# Patient Record
Sex: Male | Born: 2000 | Race: Black or African American | Hispanic: No | Marital: Single | Smoking: Never smoker
Health system: Southern US, Community
[De-identification: ages and names within clinical notes are randomized; demographics above are authoritative.]

## PROBLEM LIST (undated history)

## (undated) DIAGNOSIS — J45909 Unspecified asthma, uncomplicated: Secondary | ICD-10-CM

## (undated) DIAGNOSIS — E559 Vitamin D deficiency, unspecified: Secondary | ICD-10-CM

## (undated) HISTORY — DX: Unspecified asthma, uncomplicated: J45.909

## (undated) HISTORY — DX: Vitamin D deficiency, unspecified: E55.9

---
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
## 2020-01-31 ENCOUNTER — Encounter: Payer: Managed Care, Other (non HMO) | Admitting: Family Medicine

## 2020-05-06 ENCOUNTER — Ambulatory Visit (INDEPENDENT_AMBULATORY_CARE_PROVIDER_SITE_OTHER): Payer: Managed Care, Other (non HMO) | Admitting: Family Medicine

## 2020-05-06 ENCOUNTER — Other Ambulatory Visit: Payer: Self-pay

## 2020-05-06 ENCOUNTER — Encounter: Payer: Self-pay | Admitting: Family Medicine

## 2020-05-06 VITALS — BP 118/72 | HR 60 | Ht 69.0 in | Wt 163.0 lb

## 2020-05-06 DIAGNOSIS — F419 Anxiety disorder, unspecified: Secondary | ICD-10-CM | POA: Diagnosis not present

## 2020-05-06 DIAGNOSIS — R5383 Other fatigue: Secondary | ICD-10-CM | POA: Diagnosis not present

## 2020-05-06 DIAGNOSIS — Z23 Encounter for immunization: Secondary | ICD-10-CM

## 2020-05-06 DIAGNOSIS — E559 Vitamin D deficiency, unspecified: Secondary | ICD-10-CM

## 2020-05-06 DIAGNOSIS — F32A Depression, unspecified: Secondary | ICD-10-CM | POA: Diagnosis not present

## 2020-05-06 DIAGNOSIS — L03011 Cellulitis of right finger: Secondary | ICD-10-CM

## 2020-05-06 NOTE — Patient Instructions (Addendum)
Please contact the counseling center at school as we discussed. Try and get regular exercise. It is important that you eat--even if it isn't a lot.  Your brain and body need the fuel.   Mindfulness-Based Stress Reduction Mindfulness-based stress reduction (MBSR) is a program that helps people learn to practice mindfulness. Mindfulness is the practice of intentionally paying attention to the present moment. It can be learned and practiced through techniques such as education, breathing exercises, meditation, and yoga. MBSR includes several mindfulness techniques in one program. MBSR works best when you understand the treatment, are willing to try new things, and can commit to spending time practicing what you learn. MBSR training may include learning about:  How your emotions, thoughts, and reactions affect your body.  New ways to respond to things that cause negative thoughts to start (triggers).  How to notice your thoughts and let go of them.  Practicing awareness of everyday things that you normally do without thinking.  The techniques and goals of different types of meditation. What are the benefits of MBSR? MBSR can have many benefits, which include helping you to:  Develop self-awareness. This refers to knowing and understanding yourself.  Learn skills and attitudes that help you to participate in your own health care.  Learn new ways to care for yourself.  Be more accepting about how things are, and let things go.  Be less judgmental and approach things with an open mind.  Be patient with yourself and trust yourself more. MBSR has also been shown to:  Reduce negative emotions, such as depression and anxiety.  Improve memory and focus.  Change how you sense and approach pain.  Boost your body's ability to fight infections.  Help you connect better with other people.  Improve your sense of well-being. Follow these instructions at home:   Find a local in-person or  online MBSR program.  Set aside some time regularly for mindfulness practice.  Find a mindfulness practice that works best for you. This may include one or more of the following: ? Meditation. Meditation involves focusing your mind on a certain thought or activity. ? Breathing awareness exercises. These help you to stay present by focusing on your breath. ? Body scan. For this practice, you lie down and pay attention to each part of your body from head to toe. You can identify tension and soreness and intentionally relax parts of your body. ? Yoga. Yoga involves stretching and breathing, and it can improve your ability to move and be flexible. It can also provide an experience of testing your body's limits, which can help you release stress. ? Mindful eating. This way of eating involves focusing on the taste, texture, color, and smell of each bite of food. Because this slows down eating and helps you feel full sooner, it can be an important part of a weight-loss plan.  Find a podcast or recording that provides guidance for breathing awareness, body scan, or meditation exercises. You can listen to these any time when you have a free moment to rest without distractions.  Follow your treatment plan as told by your health care provider. This may include taking regular medicines and making changes to your diet or lifestyle as recommended. How to practice mindfulness To do a basic awareness exercise:  Find a comfortable place to sit.  Pay attention to the present moment. Observe your thoughts, feelings, and surroundings just as they are.  Avoid placing judgment on yourself, your feelings, or your surroundings. Make note  of any judgment that comes up, and let it go.  Your mind may wander, and that is okay. Make note of when your thoughts drift, and return your attention to the present moment. To do basic mindfulness meditation:  Find a comfortable place to sit. This may include a stable chair or a  firm floor cushion. ? Sit upright with your back straight. Let your arms fall next to your side with your hands resting on your legs. ? If sitting in a chair, rest your feet flat on the floor. ? If sitting on a cushion, cross your legs in front of you.  Keep your head in a neutral position with your chin dropped slightly. Relax your jaw and rest the tip of your tongue on the roof of your mouth. Drop your gaze to the floor. You can close your eyes if you like.  Breathe normally and pay attention to your breath. Feel the air moving in and out of your nose. Feel your belly expanding and relaxing with each breath.  Your mind may wander, and that is okay. Make note of when your thoughts drift, and return your attention to your breath.  Avoid placing judgment on yourself, your feelings, or your surroundings. Make note of any judgment or feelings that come up, let them go, and bring your attention back to your breath.  When you are ready, lift your gaze or open your eyes. Pay attention to how your body feels after the meditation. Where to find more information You can find more information about MBSR from:  Your health care provider.  Community-based meditation centers or programs.  Programs offered near you. Summary  Mindfulness-based stress reduction (MBSR) is a program that teaches you how to intentionally pay attention to the present moment. It is used with other treatments to help you cope better with daily stress, emotions, and pain.  MBSR focuses on developing self-awareness, which allows you to respond to life stress without judgment or negative emotions.  MBSR programs may involve learning different mindfulness practices, such as breathing exercises, meditation, yoga, body scan, or mindful eating. Find a mindfulness practice that works best for you, and set aside time for it on a regular basis. This information is not intended to replace advice given to you by your health care  provider. Make sure you discuss any questions you have with your health care provider. Document Revised: 05/07/2017 Document Reviewed: 10/01/2016 Elsevier Patient Education  2020 [employer].   Looks like you had a paronychia that you drained.  It looks to be healing/resolving.  There is a slight knot that remains.  I recommend warm soaks 2-3 times/day until that resolves.  If any increase in swelling, pain, drainage or fever, needs re-eval and antibiotics.   Paronychia Paronychia is an infection of the skin. It happens near a fingernail or toenail. It may cause pain and swelling around the nail. In some cases, a fluid-filled bump (abscess) can form near or under the nail. Usually, this condition is not serious, and it clears up with treatment. Follow these instructions at home: Wound care  Keep the affected area clean.  Soak the fingers or toes in warm water as told by your doctor. You may be told to do this for 20 minutes, 2-3 times a day.  Keep the area dry when you are not soaking it.  Do not try to drain a fluid-filled bump on your own.  Follow instructions from your doctor about how to take care of the  affected area. Make sure you: ? Wash your hands with soap and water before you change your bandage (dressing). If you cannot use soap and water, use hand sanitizer. ? Change your bandage as told by your doctor.  If you had a fluid-filled bump and your doctor drained it, check the area every day for signs of infection. Check for: ? Redness, swelling, or pain. ? Fluid or blood. ? Warmth. ? Pus or a bad smell. Medicines   Take over-the-counter and prescription medicines only as told by your doctor.  If you were prescribed an antibiotic medicine, take it as told by your doctor. Do not stop taking it even if you start to feel better. General instructions  Avoid touching any chemicals.  Do not pick at the affected area. Prevention  To prevent this condition from happening  again: ? Wear rubber gloves when putting your hands in water for washing dishes or other tasks. ? Wear gloves if your hands might touch cleaners or chemicals. ? Avoid injuring your nails or fingertips. ? Do not bite your nails or tear hangnails. ? Do not cut your nails very short. ? Do not cut the skin at the base and sides of the nail (cuticles). ? Use clean nail clippers or scissors when trimming nails. Contact a doctor if:  You feel worse.  You do not get better.  You have more fluid, blood, or pus coming from the affected area.  Your finger or knuckle is swollen or is hard to move. Get help right away if you have:  A fever or chills.  Redness spreading from the affected area.  Pain in a joint or muscle. Summary  Paronychia is an infection of the skin. It happens near a fingernail or toenail.  This condition may cause pain and swelling around the nail.  Soak the fingers or toes in warm water as told by your doctor.  Usually, this condition is not serious, and it clears up with treatment. This information is not intended to replace advice given to you by your health care provider. Make sure you discuss any questions you have with your health care provider. Document Revised: 06/11/2017 Document Reviewed: 06/07/2017 Elsevier Patient Education  2020 [employer].

## 2020-05-06 NOTE — Progress Notes (Signed)
Chief Complaint  Patient presents with  . Anxiety    has been dealing with some anxiety and sometimes feels sad. Loss of appetite and sleeps only about 5 hours a day, this has been going on for about a month.  His mom also wanted to know if we could do a covid test-wonders if maybe he has had covid in the recent past.   . Blister    had a blister on his finger on his right hand that was full of pus, he popped it and just wanted you to look at it to make sure it's not infected or in need of an antibiotic.    Patient presents to discuss anxiety, decreased appetite, weight loss.  He reports he has had symptoms x 1 month--tired a lot, not sleeping well at night--takes a long time to fall asleep.  He gets hungry, but loses appetite quickly.  Has been anxious, feels heart racing, can happen any time, even just watching TV.  He has noticed weight loss in chest/abdomen (showed me pictures on his phone of what he used to look like).  Hasn't been to the gym in 3 weeks, hasn't had time.  Not doing well in chem class (taking for the 2nd time), and this worries him.  He reports he "broke down" when talking to his mother last night, who became very concerned and scheduled visit.   He gave me permission to speak with her after the visit. She reported that there was also a breakup that may be contributing.  She noticed him not eating (and was worried that he could have been sick, worried about post-COVID--patient states he had NOT been ill at all).  Second concern today was that of a lesion/sore on his R index finger.  He reports it was swollen and painful.  They used a pin to drain it, drained pus and he reports it feels much better.  Denies any ongoing pain or recurrent swelling.  No injury/trauma.  Denies fever, chills.  Vitamin D deficiency noted in 10/2019, level of 21.1. He had been taking a daily D3, but stopped a couple of weeks ago (didn't run out, just stopped).  PMH, PSH, SH reviewed. Student at  [employer], with upcoming finals He denies any substance abuse  Outpatient Encounter Medications as of 05/06/2020  Medication Sig  . albuterol (VENTOLIN HFA) 108 (90 Base) MCG/ACT inhaler INHALE 2 PUFFS INTO LUNGS EVERY 6 HOURS AS NEEDED FOR WHEEZING OR SHORTNESS OF BREATH (Patient not taking: Reported on 05/06/2020)  . loratadine (CLARITIN) 10 MG tablet Take 10 mg by mouth daily. (Patient not taking: Reported on 05/06/2020)  . Vitamin D, Ergocalciferol, 50 MCG (2000 UT) CAPS Take 1 capsule by mouth daily. (Patient not taking: Reported on 05/06/2020)  . [DISCONTINUED] Vitamin D, Ergocalciferol, (DRISDOL) 1.25 MG (50000 UNIT) CAPS capsule Take 1 capsule (50,000 Units total) by mouth every 7 (seven) days.   No facility-administered encounter medications on file as of 05/06/2020.   No Known Allergies  ROS: no fever, chills, URI symptoms, headaches, dizziness, chest pain.  +depression, anxiety, trouble sleeping.  +decreased appetite and weight loss (14# since last visit).  No abdominal pain, nausea, vomiting or bowel changes.  No bleeding, bruising, rashes. See HPI re: abscess on fingertip.   PHYSICAL EXAM:  BP 118/72   Pulse 60   Ht 5\' 9"  (1.753 m)   Wt 163 lb (73.9 kg)   BMI 24.07 kg/m   Wt Readings from Last 3 Encounters:  05/06/20  163 lb (73.9 kg) (62 %, Z= 0.30)*  11/02/19 177 lb 6.4 oz (80.5 kg) (80 %, Z= 0.84)*  10/25/19 173 lb (78.5 kg) (76 %, Z= 0.71)*   * Growth percentiles are based on CDC (Boys, 2-20 Years) data.   Pleasant, cooperative, polite male, in no distress HEENT: conjunctiva and sclera are clear, EOMI, wearing mask Neck: No lymphadenopathy, thyromegaly or mass Heart: regular rate and rhythm, no murmur Lungs: clear bilaterally Abdomen: soft, nontender, no mass Extremities; no edema.  Radial aspect of R ring finger--there is some thickening of skin at edge of nail. Very small area that is pink more laterally.  Slight firmness felt within the fingertip in  this area, nontender. Psych: depressed/anxious mood, full range of affect. Normal eye contact, speech, hygiene and grooming.  Denies SI. Neuro: alert and oriented. Normal gait   PHQ-9 score of 10 GAD-7 score of 7   ASSESSMENT/PLAN:  Fatigue, unspecified type - will check labs.  Discussed the need to eat to maintain his energy and fuel the brain for his studies.   - Plan: TSH, CBC with Differential/Platelet  Depression, unspecified depression type - and anxiety/stress--related to school stress, and possibly recent breakup. Encouraged him to reach out to counseling center on campus. - Plan: TSH  Anxiety - discussed stress reduction, mindfulness, exercise. Encouraged counseling.  Paronychia of finger of right hand - Drained prior to visit, and significantly improved.  no e/o cellulitis or need for oral ABX. Rec cont'd soaks  Vitamin D deficiency - recently stopped his vitamin.  Encouraged him to restart. - Plan: VITAMIN D 25 Hydroxy (Vit-D Deficiency, Fractures)  Need for influenza vaccination - Plan: Flu Vaccine QUAD 6+ mos PF IM (Fluarix Quad PF)  Pt was advised to contact counseling center on campus (vs therapists in [place] for virtual visits).  I suspect that campus should have accessible therapists, given recent light to issues/suicides on campus.  He was not interested in any medications at this time.  Discussed importance of taking proper care of himself--eating, exercise. We did not directly discuss relationship breakup (only mentioned by mother when we spoke separately).   Paronychia, self-drained.  Some residual thickening, but otherwise is nontender and seems to be healing. Warm soaks recommended until finger feels normal. If any increase in swelling, pain, drainage or fever, needs re-eval and antibiotics.  I spent 30-35 FTF minutes, total of closer to 40 minutes of time dedicated to the care of this patient, including pre-visit review of records, face to face time, post-visit  ordering of testing and documentation.

## 2020-05-07 ENCOUNTER — Encounter: Payer: Self-pay | Admitting: Family Medicine

## 2020-05-07 LAB — CBC WITH DIFFERENTIAL/PLATELET
Basophils Absolute: 0 10*3/uL (ref 0.0–0.2)
Basos: 1 %
EOS (ABSOLUTE): 0 10*3/uL (ref 0.0–0.4)
Eos: 1 %
Hematocrit: 51 % (ref 37.5–51.0)
Hemoglobin: 16.5 g/dL (ref 13.0–17.7)
Immature Grans (Abs): 0 10*3/uL (ref 0.0–0.1)
Immature Granulocytes: 0 %
Lymphocytes Absolute: 1.6 10*3/uL (ref 0.7–3.1)
Lymphs: 25 %
MCH: 26.5 pg — ABNORMAL LOW (ref 26.6–33.0)
MCHC: 32.4 g/dL (ref 31.5–35.7)
MCV: 82 fL (ref 79–97)
Monocytes Absolute: 0.5 10*3/uL (ref 0.1–0.9)
Monocytes: 7 %
Neutrophils Absolute: 4.1 10*3/uL (ref 1.4–7.0)
Neutrophils: 66 %
Platelets: 262 10*3/uL (ref 150–450)
RBC: 6.23 x10E6/uL — ABNORMAL HIGH (ref 4.14–5.80)
RDW: 13.4 % (ref 11.6–15.4)
WBC: 6.2 10*3/uL (ref 3.4–10.8)

## 2020-05-07 LAB — VITAMIN D 25 HYDROXY (VIT D DEFICIENCY, FRACTURES): Vit D, 25-Hydroxy: 30.4 ng/mL (ref 30.0–100.0)

## 2020-05-07 LAB — TSH: TSH: 1.34 u[IU]/mL (ref 0.450–4.500)

## 2020-05-20 ENCOUNTER — Ambulatory Visit (INDEPENDENT_AMBULATORY_CARE_PROVIDER_SITE_OTHER): Payer: Managed Care, Other (non HMO) | Admitting: Family Medicine

## 2020-05-20 ENCOUNTER — Other Ambulatory Visit: Payer: Self-pay

## 2020-05-20 ENCOUNTER — Encounter: Payer: Self-pay | Admitting: Family Medicine

## 2020-05-20 VITALS — BP 110/70 | HR 76 | Temp 98.6°F | Wt 166.6 lb

## 2020-05-20 DIAGNOSIS — L03011 Cellulitis of right finger: Secondary | ICD-10-CM | POA: Diagnosis not present

## 2020-05-20 MED ORDER — DOXYCYCLINE HYCLATE 100 MG PO TABS: 100.0000 mg | ORAL_TABLET | Freq: Two times a day (BID) | ORAL | 0 refills | Status: DC

## 2020-05-20 NOTE — Patient Instructions (Signed)
  Soak the finger in soapy water (warm) 2-3 times daily. Use antibacterial ointment (such as bacitracin) to the nail edge where it drained. STOP BITING THE NAILS/CUTICLES  I'm giving you a prescription for an antibiotic to take for a week if you have any persistent discomfort, drainage or redness. I think you can hold off on starting this for a day or two, as currently there doesn't seem to be much infection within the skin (just what you already drained from under the skin). But if increasing redness, pain, or recurrent pus forms, then take the full course.

## 2020-05-20 NOTE — Progress Notes (Signed)
Chief Complaint  Patient presents with  . infected finger    Infected right ring finger. Popped it last night and got some pus out of it but still sore.   Patient presents with infected right ring finger.  He reports that it started swelling and hurtingl 2 days ago. Last night he pressed on it towards the nail, and pus came out. Cleaned it with it with peroxide. Today it feels better.  He recently had the same thing happen on the other side of the right ring finger (currently on ulnar side, last time was on the radial side).  He notices a little pinkness on the radial side, and still feels a hard bump in the soft tissue on that side, since it drained.  This knot isn't tender.  He does admit to biting his nails/cuticles, mainly just the R ring finger, per patient.   He was last seen for a lot of stress.  He has finished his finals, passed all his classes, and reports feeling much better today.  He has plans to see family over the holidays and is looking forward to this.   PMH, PSH, SH reviewed  Outpatient Encounter Medications as of 05/20/2020  Medication Sig  . albuterol (VENTOLIN HFA) 108 (90 Base) MCG/ACT inhaler INHALE 2 PUFFS INTO LUNGS EVERY 6 HOURS AS NEEDED FOR WHEEZING OR SHORTNESS OF BREATH  . loratadine (CLARITIN) 10 MG tablet Take 10 mg by mouth daily.  . Vitamin D, Ergocalciferol, 50 MCG (2000 UT) CAPS Take 1 capsule by mouth daily.   No facility-administered encounter medications on file as of 05/20/2020.   No Known Allergies  ROS: no fever, chills, headaches, dizziness, nausea, vomiting, rash or any other concerns.  Moods are better. No further weight loss (some weight regained).   PHYSICAL EXAM:  BP 110/70   Pulse 76   Temp 98.6 F (37 C)   Wt 166 lb 9.6 oz (75.6 kg)   BMI 24.60 kg/m   Wt Readings from Last 3 Encounters:  05/20/20 166 lb 9.6 oz (75.6 kg) (66 %, Z= 0.42)*  05/06/20 163 lb (73.9 kg) (62 %, Z= 0.30)*  11/02/19 177 lb 6.4 oz (80.5 kg) (80 %,  Z= 0.84)*   * Growth percentiles are based on CDC (Boys, 2-20 Years) data.   Well-appearing, pleasant male, in good spirits, in no distress Examination of R ring finger-- At the radial side of the nail, it appears slightly pink though not warm. It is nontender.  Still noted to have a slight nodule deeper (notender)  Ulnar side--slightly whitish/pale. Nontender. No further drainage. No swelling or redness. Other fingers/nails all appear normal.   ASSESSMENT/PLAN:  Paronychia of finger, right - 2nd occurrence on same finger (ring), on opposide sidies of nail, related to biting nails/cuticles. Soaks, avoid biting; start ABX if recurs/worsens - Plan: doxycycline (VIBRA-TABS) 100 MG tablet   Booster recommended--a few days too early to give today. Encouraged to schedule [facility] for COVID booster.   Soak the finger in soapy water (warm) 2-3 times daily. Use antibacterial ointment (such as bacitracin) to the nail edge where it drained. STOP BITING THE NAILS/CUTICLES  I'm giving you a prescription for an antibiotic to take for a week if you have any persistent discomfort, drainage or redness. I think you can hold off on starting this for a day or two, as currently there doesn't seem to be much infection within the skin (just what you already drained from under the skin). But if increasing redness,  pain, or recurrent pus forms, then take the full course.

## 2020-05-28 ENCOUNTER — Ambulatory Visit (INDEPENDENT_AMBULATORY_CARE_PROVIDER_SITE_OTHER): Payer: Managed Care, Other (non HMO)

## 2020-05-28 DIAGNOSIS — Z23 Encounter for immunization: Secondary | ICD-10-CM

## 2020-06-14 ENCOUNTER — Encounter: Payer: Self-pay | Admitting: Family Medicine

## 2020-10-10 ENCOUNTER — Telehealth: Payer: BC Managed Care – PPO | Admitting: Family Medicine

## 2020-10-10 ENCOUNTER — Other Ambulatory Visit: Payer: Self-pay

## 2020-10-10 ENCOUNTER — Ambulatory Visit
Admission: EM | Admit: 2020-10-10 | Discharge: 2020-10-10 | Disposition: A | Discharge status: Home / Self Care | Payer: BC Managed Care – PPO | Attending: Emergency Medicine | Admitting: Emergency Medicine

## 2020-10-10 DIAGNOSIS — J0140 Acute pansinusitis, unspecified: Secondary | ICD-10-CM | POA: Diagnosis not present

## 2020-10-10 DIAGNOSIS — R059 Cough, unspecified: Secondary | ICD-10-CM

## 2020-10-10 DIAGNOSIS — L42 Pityriasis rosea: Secondary | ICD-10-CM

## 2020-10-10 DIAGNOSIS — R21 Rash and other nonspecific skin eruption: Secondary | ICD-10-CM

## 2020-10-10 DIAGNOSIS — R0981 Nasal congestion: Secondary | ICD-10-CM

## 2020-10-10 DIAGNOSIS — L02214 Cutaneous abscess of groin: Secondary | ICD-10-CM

## 2020-10-10 MED ORDER — ALBUTEROL SULFATE (2.5 MG/3ML) 0.083% IN NEBU: 2.5000 mg | INHALATION_SOLUTION | Freq: Four times a day (QID) | RESPIRATORY_TRACT | 12 refills | Status: DC | PRN

## 2020-10-10 MED ORDER — PREDNISONE 10 MG (21) PO TBPK: ORAL_TABLET | Freq: Every day | ORAL | 0 refills | Status: DC

## 2020-10-10 MED ORDER — AMOXICILLIN-POT CLAVULANATE 875-125 MG PO TABS: 1.0000 | ORAL_TABLET | Freq: Two times a day (BID) | ORAL | 0 refills | Status: AC

## 2020-10-10 MED ORDER — BENZONATATE 100 MG PO CAPS: 100.0000 mg | ORAL_CAPSULE | Freq: Three times a day (TID) | ORAL | 0 refills | Status: DC

## 2020-10-10 NOTE — ED Triage Notes (Signed)
Pt presents with c/o nasal congestion and cough for over a week, has productive cough and h/o asthma

## 2020-10-10 NOTE — ED Provider Notes (Signed)
[facility]   [id number] 10/10/20 Arrival Time: 1151   CC: multiple complaints  SUBJECTIVE: History from: patient and family.  Craig Marquez is a 20 y.o. male who presents with runny nose, congestion, cough with green sputum x 1 week.  Denies sick exposure to COVID, flu or strep.  Denies alleviating or aggravating factors.  Denies previous symptoms in the past.   History significant for asthma.  Denies fever, chills, fatigue, sinus pain, rhinorrhea, sore throat, SOB, wheezing, chest pain, nausea, changes in bowel or bladder habits.    Also reports rash to torso.  Unsure how long.  Denies precipitating event.  Denies pain or itching.  Denies sores in mouth or groin  Bump in LT groin.  Reports shaving.  Mildly sore to the touch.     ROS: As per HPI.  All other pertinent ROS negative.     Past Medical History:  Diagnosis Date  . Childhood asthma   . Elbow fracture, left 2005   casted  . Vitamin D deficiency    History reviewed. No pertinent surgical history. No Known Allergies No current facility-administered medications on file prior to encounter.   Current Outpatient Medications on File Prior to Encounter  Medication Sig Dispense Refill  . doxycycline (VIBRA-TABS) 100 MG tablet Take 1 tablet (100 mg total) by mouth 2 (two) times daily. 14 tablet 0  . loratadine (CLARITIN) 10 MG tablet Take 10 mg by mouth daily.    . Vitamin D, Ergocalciferol, 50 MCG (2000 UT) CAPS Take 1 capsule by mouth daily.     Social History   Socioeconomic History  . Marital status: Single    Spouse name: Not on file  . Number of children: Not on file  . Years of education: Not on file  . Highest education level: Not on file  Occupational History  . Not on file  Tobacco Use  . Smoking status: Never Smoker  . Smokeless tobacco: Never Used  Vaping Use  . Vaping Use: Never used  Substance and Sexual Activity  . Alcohol use: Not Currently    Alcohol/week: 0.0 standard drinks     Comment: 4-5 on the weekends in college  . Drug use: Never  . Sexual activity: Yes    Partners: Female    Birth control/protection: Condom  Other Topics Concern  . Not on file  Social History Narrative   Graduate of [employer] and [employer] sophomore at [employer] (poli-sci major, poss pre-med)   Parents are married.  1 younger sister.   No tobacco exposure.   Played football for [employer] in [employer]   Plays basketball (pick-up) at school   Working as a Scientist, physiological for [employer].      Updated 10/2019   Social Determinants of Health   Financial Resource Strain: Not on file  Food Insecurity: Not on file  Transportation Needs: Not on file  Physical Activity: Not on file  Stress: Not on file  Social Connections: Not on file  Intimate Partner Violence: Not on file   Family History  Problem Relation Age of Onset  . Diabetes Mother   . Hypertension Father   . Heart attack Paternal Grandmother     OBJECTIVE:  Vitals:   10/10/20 1218  BP: 115/76  Pulse: 71  Resp: 20  Temp: 98.2 F (36.8 C)  SpO2: 96%     General appearance: alert; appears fatigued, but nontoxic; speaking in full sentences and  tolerating own secretions HEENT: NCAT; Ears: EACs clear, TMs pearly gray; Eyes: PERRL.  EOM grossly intact. Nose: nares patent without rhinorrhea, turbinates swollen and boggy, Throat: oropharynx clear, tonsils non erythematous or enlarged, uvula midline  Neck: supple without LAD Lungs: unlabored respirations, symmetrical air entry; cough: moderate; no respiratory distress; CTAB Heart: regular rate and rhythm.  Radial pulses 2+ symmetrical bilaterally Skin: warm and dry; 1-2 cm area of induration to LT groin, mildly TTP, no obvious drainage or bleeding Psychological: alert and cooperative; normal mood and affect   ASSESSMENT & PLAN:  1. Acute non-recurrent pansinusitis   2. Nasal congestion   3. Cough   4. Rash and nonspecific skin eruption   5.  Pityriasis rosea   6. Abscess of left groin     Meds ordered this encounter  Medications  . amoxicillin-clavulanate (AUGMENTIN) 875-125 MG tablet    Sig: Take 1 tablet by mouth every 12 (twelve) hours for 10 days.    Dispense:  20 tablet    Refill:  0    Order Specific Question:   Supervising Provider    Answer:   Eustace Moore [[id number]]  . predniSONE (STERAPRED UNI-PAK 21 TAB) 10 MG (21) TBPK tablet    Sig: Take by mouth daily. Take 6 tabs by mouth daily  for 2 days, then 5 tabs for 2 days, then 4 tabs for 2 days, then 3 tabs for 2 days, 2 tabs for 2 days, then 1 tab by mouth daily for 2 days    Dispense:  42 tablet    Refill:  0    Order Specific Question:   Supervising Provider    Answer:   Eustace Moore [[id number]]  . albuterol (PROVENTIL) (2.5 MG/3ML) 0.083% nebulizer solution    Sig: Take 3 mLs (2.5 mg total) by nebulization every 6 (six) hours as needed for wheezing or shortness of breath.    Dispense:  75 mL    Refill:  12    Order Specific Question:   Supervising Provider    Answer:   Eustace Moore [[id number]]  . benzonatate (TESSALON) 100 MG capsule    Sig: Take 1 capsule (100 mg total) by mouth every 8 (eight) hours.    Dispense:  21 capsule    Refill:  0    Order Specific Question:   Supervising Provider    Answer:   Eustace Moore [[id number]]    Get plenty of rest and push fluids Steroid prescribed Albuterol inhaler Tessalon Perles prescribed for cough Augmentin for sinus infection Use OTC zyrtec for nasal congestion, runny nose, and/or sore throat Use OTC flonase for nasal congestion and runny nose Use medications daily for symptom relief Use OTC medications like ibuprofen or tylenol as needed fever or pain Call or go to the ED if you have any new or worsening symptoms such as fever, worsening cough, shortness of breath, chest tightness, chest pain, turning blue, changes in mental status, etc...   Rash most likely viral- pityriasis rosea  Bump  in groin most likely folliculitis.  Augmentin should help with this.  Follow up with PCP as needed  Reviewed expectations re: course of current medical issues. Questions answered. Outlined signs and symptoms indicating need for more acute intervention. Patient verbalized understanding. After Visit Summary given.        Rennis Harding, PA-C 10/10/20 1303

## 2020-10-10 NOTE — Discharge Instructions (Addendum)
Get plenty of rest and push fluids Steroid prescribed Albuterol inhaler Tessalon Perles prescribed for cough Augmentin for sinus infection Use OTC zyrtec for nasal congestion, runny nose, and/or sore throat Use OTC flonase for nasal congestion and runny nose Use medications daily for symptom relief Use OTC medications like ibuprofen or tylenol as needed fever or pain Call or go to the ED if you have any new or worsening symptoms such as fever, worsening cough, shortness of breath, chest tightness, chest pain, turning blue, changes in mental status, etc...   Rash most likely viral- pityriasis rosea  Bump in groin most likely folliculitis.  Augmentin should help with this.  Follow up with PCP as needed

## 2020-10-15 DIAGNOSIS — L700 Acne vulgaris: Secondary | ICD-10-CM | POA: Diagnosis not present

## 2020-10-15 DIAGNOSIS — B360 Pityriasis versicolor: Secondary | ICD-10-CM | POA: Diagnosis not present

## 2020-10-15 DIAGNOSIS — L730 Acne keloid: Secondary | ICD-10-CM | POA: Diagnosis not present

## 2020-10-30 ENCOUNTER — Ambulatory Visit (INDEPENDENT_AMBULATORY_CARE_PROVIDER_SITE_OTHER): Payer: BC Managed Care – PPO | Admitting: Family Medicine

## 2020-10-30 ENCOUNTER — Encounter: Payer: Self-pay | Admitting: Family Medicine

## 2020-10-30 ENCOUNTER — Other Ambulatory Visit: Payer: Self-pay

## 2020-10-30 VITALS — BP 122/78 | HR 60 | Ht 69.0 in | Wt 169.0 lb

## 2020-10-30 DIAGNOSIS — H6123 Impacted cerumen, bilateral: Secondary | ICD-10-CM

## 2020-10-30 NOTE — Progress Notes (Signed)
Chief Complaint  Patient presents with  . Ear Fullness    Ear fullness, L worse than R. No pain. Also needs to cancel his CPE 11/28/20-internship in [place]. Last CPE 11/01/20-leaves 11/09/20. Wasn't sure if afternoon of 11/06/20 would work or if you were okay with him waiting (has two weeks in between getting back and when school starts).   Patient presents with complaint of both ears feeling stopped up again, L more than R. Some throbbing in the left ear last night, better today. No fever, chills.  Slight allergies, relieved by claritin. No sinus pain, cough.  He has had issues with cerumen impaction in the past. He continues to use ear buds. He does not use q-tips.  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  Needs to r/s his physical.   PMH, PSH, SH reviewed  Outpatient Encounter Medications as of 10/30/2020  Medication Sig  . ketoconazole (NIZORAL) 2 % shampoo Apply 1 application topically 3 (three) times a week.  . loratadine (CLARITIN) 10 MG tablet Take 10 mg by mouth daily.  Marland Kitchen tretinoin (RETIN-A) 0.025 % cream Apply 1 application topically at bedtime.  . triamcinolone ointment (KENALOG) 0.1 % Apply topically 2 (two) times daily.  . Vitamin D, Ergocalciferol, 50 MCG (2000 UT) CAPS Take 1 capsule by mouth daily.  . [DISCONTINUED] albuterol (PROVENTIL) (2.5 MG/3ML) 0.083% nebulizer solution Take 3 mLs (2.5 mg total) by nebulization every 6 (six) hours as needed for wheezing or shortness of breath. (Patient not taking: Reported on 10/30/2020)  . [DISCONTINUED] benzonatate (TESSALON) 100 MG capsule Take 1 capsule (100 mg total) by mouth every 8 (eight) hours.  . [DISCONTINUED] doxycycline (VIBRA-TABS) 100 MG tablet Take 1 tablet (100 mg total) by mouth 2 (two) times daily.  . [DISCONTINUED] predniSONE (STERAPRED UNI-PAK 21 TAB) 10 MG (21) TBPK tablet Take by mouth daily. Take 6 tabs by mouth daily  for 2 days, then 5 tabs for 2 days, then 4 tabs for 2 days,  then 3 tabs for 2 days, 2 tabs for 2 days, then 1 tab by mouth daily for 2 days   No facility-administered encounter medications on file as of 10/30/2020.   No Known Allergies  ROS: no fever, chills, URI symptoms, cough, shortness of breath, chest pain.  +bilateral ear plugging per HPI.  No other complaints   PHYSICAL EXAM:  BP 122/78   Pulse 60   Ht 5\' 9"  (1.753 m)   Wt 169 lb (76.7 kg)   BMI 24.96 kg/m   Well-appearing, pleasant male, in no distress HEENT: conjunctiva and sclera are clear, EOMI. Ears: canals occluded with cerumen. After bilateral ear lavage, only residual cerumen and some epithelial tissue.  EAC's appear normal (only slightly red s/p lavage on the left, posteriorly).  TM's are normal. Neck: no lymphadenopathy   ASSESSMENT/PLAN:  Bilateral impacted cerumen - resolved s/p lavage.  Normal ear exam and symptoms resolved. Discussed using headphones rather than ear buds, prn use of debrox drops   R/s CPE to 6/1 (vs after he returns if more convenient).

## 2020-11-03 NOTE — Progress Notes (Signed)
Chief Complaint  Patient presents with  . Annual Exam    Nonfasting annual exam. I do not remember seeing any records. Would like STD testing.     Craig Marquez is a 20 y.o. male who presents for a complete physical.   He was seen last week with cerumen impaction. His hearing is back to normal.  He would like STD testing today, before potentially starting a new relationship. Only had unprotected intercourse once. 6 total lifetime partners.  Vitamin D deficiency: level was low at 21.2 in 10/2019, when he hadn't taken supplements in a couple of months. He was treated with 8 weeks of prescription therapy.  Recheck in 04/2020 was normal at 30.4, after taking daily D3, but had stopped a few weeks prior to the test. He is currently taking 2000 IU daily (tries to, occasionally misses it).  He had issues in 04/2020 with anxiety, decreased appetite, fatigue. He had a lot of school stress at that time, as well as a relationship break-up. At that time:  PHQ-9 score of 10, GAD-7 score of 7. Counseling was encouraged. He felt better after talking to a friend, never contacted counseling center. He handled exams in the Spring better. Didn't do well in a chem class (got a D). He was thinking pre-med, but he will be working with a Psychologist, counselling over the summer, and may change his path.  H/o childhood asthma.   He used nebulizer once with a recent sinus infection. Denies any concern now, even with exercise.  Sees dermatologist, treated for acne, tinea versicolor (started on nizoral shampoo 2-3 weeks ago, clearing up). Uses the TAC for bumps on back of his scalp.  Immunization History  Administered Date(s) Administered  . DTaP 09/13/2000, 11/11/2000, 01/18/2001, 01/20/2002, 07/18/2004  . HPV Quadrivalent 08/12/2012, 05/31/2013, 08/25/2013  . Hepatitis A 02/23/2004, 08/16/2007  . Hepatitis B 03-22-2001, 08/12/2000, 04/22/2001  . HiB (PRP-OMP) 09/13/2000, 11/11/2000, 01/18/2001, 07/30/2003  . IPV 09/13/2000,  11/11/2000, 01/18/2001, 07/18/2004  . Influenza,inj,Quad PF,6+ Mos 05/06/2020  . Influenza-Unspecified 04/23/2004, 08/16/2007, 04/23/2008, 03/28/2010, 05/29/2011, 03/25/2012, 08/25/2013, 03/09/2014, 03/08/2015, 05/01/2018  . MMR 07/29/2001, 07/18/2004  . Meningococcal Conjugate 10/21/2016, 10/21/2016  . PFIZER(Purple Top)SARS-COV-2 Vaccination 11/02/2019, 11/23/2019, 05/28/2020  . Pneumococcal Conjugate-13 09/13/2000, 12/01/2000, 12/24/2000, 01/20/2002  . Pneumococcal Polysaccharide-23 08/12/2012  . Tdap 05/29/2011, 11/11/2018  . Varicella 07/29/2001, 02/22/2006   Dentist: twice yearly Ophtho: has glasses, yearly Exercise: gym 4x/week--weights, stairmaster and elliptical. Also some pickup basketball at school (infrequent). Alcohol:  Fridays and Saturdays while in college (2-3 drinks (vodka) at a time). 6 lifetime sexual partners. Using condoms regularly (only once without) 4-5 hours of screentime daily. Sleeps 8 hours/night.  Lipid screen: Lab Results  Component Value Date   CHOL 171 (H) 11/02/2019   HDL 49 11/02/2019   LDLCALC 113 (H) 11/02/2019   TRIG 43 11/02/2019   CHOLHDL 3.5 11/02/2019    PMH, PSH, SH, FH reviewed  Outpatient Encounter Medications as of 11/06/2020  Medication Sig  . ketoconazole (NIZORAL) 2 % shampoo Apply 1 application topically 3 (three) times a week.  . loratadine (CLARITIN) 10 MG tablet Take 10 mg by mouth daily.  Marland Kitchen tretinoin (RETIN-A) 0.025 % cream Apply 1 application topically at bedtime.  . triamcinolone ointment (KENALOG) 0.1 % Apply topically 2 (two) times daily.  . Vitamin D, Ergocalciferol, 50 MCG (2000 UT) CAPS Take 1 capsule by mouth daily.   No facility-administered encounter medications on file as of 11/06/2020.   No Known Allergies  ROS:  The patient denies  anorexia, fever, headaches,  vision loss, decreased hearing, ear pain, chest pain, palpitations, dizziness, syncope, dyspnea on exertion, cough, swelling, nausea, vomiting, diarrhea,  constipation, abdominal pain, melena, hematochezia, indigestion/heartburn, hematuria, incontinence, erectile dysfunction, nocturia, weakened urine stream, dysuria, genital lesions, joint pains, numbness, tingling, weakness, tremor, suspicious skin lesions, depression, anxiety, abnormal bleeding/bruising, or enlarged lymph nodes No changes to birthmarks on trunk (size/shape or number) Rash on chest (tinea versicolor), resolving with treatment from dermatologist.   PHYSICAL EXAM:  BP 110/70   Pulse 68   Ht 5' 9" (1.753 m)   Wt 167 lb 6.4 oz (75.9 kg)   BMI 24.72 kg/m   Wt Readings from Last 3 Encounters:  11/06/20 167 lb 6.4 oz (75.9 kg)  10/30/20 169 lb (76.7 kg)  05/20/20 166 lb 9.6 oz (75.6 kg) (66 %, Z= 0.42)*   * Growth percentiles are based on CDC (Boys, 2-20 Years) data.    General Appearance:    Alert, cooperative, no distress, appears stated age  Head:    Normocephalic, without obvious abnormality, atraumatic  Eyes:    PERRL, conjunctiva/corneas clear, EOM's intact, fundi    benign  Ears:    Normal TM's and external ear canals  Nose:   Normal external appearance  Throat:   Normal oropharynx without lesions or erythema  Neck:   Supple, no lymphadenopathy;  thyroid:  no enlargement/ tenderness/nodules; no carotid bruit or JVD  Back:    Spine nontender, no curvature, ROM normal, no CVA     tenderness  Lungs:     Clear to auscultation bilaterally without wheezes, rales or     ronchi; respirations unlabored  Chest Wall:    No tenderness or deformity   Heart:    Regular rate and rhythm, S1 and S2 normal, no murmur, rub   or gallop  Breast Exam:    No chest wall tenderness, masses or gynecomastia  Abdomen:     Soft, non-tender, nondistended, normoactive bowel sounds,    no masses, no hepatosplenomegaly  Genitalia:    Normal male external genitalia, circumsized, without lesions.  Testicles without masses.  No inguinal hernias.  Rectal:   Deferred due to age <40 and lack of  symptoms  Extremities:   No clubbing, cyanosis or edema  Pulses:   2+ and symmetric all extremities  Skin:   Skin color, texture, turgor normal, no rashes or lesions. 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 There is faint hyperpigmentation at left neck/clavicle area and lower chest (fading, per pt, with treatment for tinea versicolor); back is clear.  Lymph nodes:   Cervical, supraclavicular, and  Inguinal nodes normal  Neurologic:   Normal strength, sensation and gait; reflexes 2+ and symmetric throughout            Psych:   Normal mood, affect, hygiene and grooming.    Depression screen negative.   ASSESSMENT/PLAN:  Annual physical exam - Plan: POCT Urinalysis DIP (Proadvantage Device), GC/Chlamydia Probe Amp, RPR, HIV Antibody (routine testing w rflx), Hepatitis C antibody  Vitamin D deficiency - Plan: VITAMIN D 25 Hydroxy (Vit-D Deficiency, Fractures)  Screen for STD (sexually transmitted disease) - Plan: GC/Chlamydia Probe Amp, RPR, HIV Antibody (routine testing w rflx)  Need for hepatitis C screening test - Plan: Hepatitis C antibody  Tinea versicolor - improving with treatment with nizoral shampoo   GC/chlamydia HIV, RPR, HepC, vit D  No records received from Dr. Adair Patter got Men B vaccines.  Will sign release of records. (all vaccines  received through [facility]).   Discussed at least 30 minutes of aerobic activity at least 5 days/week, weight-bearing exercise at least 2x/week; proper sunscreen use reviewed; healthy diet and alcohol recommendations reviewed; regular seatbelt use; safe sex (condoms, Plan B), alcohol/drug/tobacco counseling.  Immunization recommendations discussed--continue yearly flu shots.  Will check to see if he ever got meningitis B vaccines

## 2020-11-03 NOTE — Patient Instructions (Addendum)
Well Child Safety, Young Adult This sheet provides general safety recommendations. Talk with a health care provider if you have any questions. Home safety  Make sure your home or apartment has smoke detectors and carbon monoxide detectors. Test them once a month. Change their batteries every year.  If you keep guns and ammunition in the home, make sure they are stored separately and locked away.  Make your home a tobacco-free and drug-free environment. Motor vehicle safety  Wear a seat belt whenever you drive or ride in a vehicle.  Do not text, talk, or use your phone or other mobile devices while driving.  Do not drive when you are tired. If you feel like you may fall asleep while driving, pull over at a safe location and take a break or switch drivers.  Do not drive after drinking or using drugs. Plan for a designated driver or another way to go home.  Do not ride in a car with someone who has been using drugs or alcohol.  Do not ride in the bed or cargo area of a pickup truck.   Sun safety  Use broad-spectrum sunscreen that protects against UVA and UVB radiation (SPF 15 or higher). ? Put on sunscreen 15-30 minutes before going outside. ? Reapply sunscreen every 2 hours, or more often if you get wet or if you are sweating. ? Use enough sunscreen to cover all exposed areas. Rub it in well.  Wear sunglasses when you are out in the sun.  Do not use tanning beds. Tanning beds are just as harmful for your skin as the sun.   Water safety  Never swim alone.  Only swim in designated areas.  Do not swim in areas where you do not know the water conditions or where underwater hazards are located. General instructions  Protect your hearing and avoid exposure to loud music or noises by: ? Wearing ear protection when you are in a noisy environment (while using loud machinery, like a lawn mower, or at concerts). ? Making sure that the volume is not too loud when listening to music in  the car or through headphones.  Avoid tattoos and body piercings. Tattoos and body piercings can get infected. Personal safety  Do not use tobacco, drugs, anabolic steroids, or diet pills.  Do not drink or use drugs while swimming, boating, riding a bike or motorcycle, or using heavy machinery.  Do not drink heavily (binge drink). Your brain is still developing, and alcohol can affect your brain development.  Wear protective gear for sports and other physical activities, such as a helmet, mouth guard, eye protection, wrist guards, elbow pads, and knee pads. Wear a helmet when biking, riding a motorcycle or all-terrain vehicle (ATV), skateboarding, skiing, or snowboarding.  If you are sexually active, practice safe sex. Use a condom or other form of birth control (contraception) in order to prevent pregnancy and STIs (sexually transmitted infections).  Never leave a party or event alone without telling a friend that you are leaving. Never leave with a stranger.  Do not misuse medicines. This means that you should not take a medicine other than how it is prescribed and you should not take someone else's medicine.  Avoid risky situations or situations where you do not feel safe. Call for help if you find yourself in an unsafe situation.  Learn to manage conflict without using violence.  Never accept a drink from a stranger if you do not know where the drink came from.    Avoid people who suggest unsafe or harmful behavior, and avoid unhealthy romantic relationships or friendships where you do not feel respected. No one has the right to pressure you into any activity that makes you feel uncomfortable. If others make you feel unsafe, you can: ? Ask for help from your parents or guardians, your health care provider, or other trusted adults like a Runner, broadcasting/film/video, coach, or counselor. ? Call the [employer] Violence Hotline at (641)603-4026 or go online: www.thehotline.org Where to find more  information:  American Academy of Pediatrics: www.healthychildren.org  Centers for Disease Control and Prevention: FootballExhibition.com.br Summary  Protect yourself from sun exposure by using broad-spectrum sunscreen that protects against UVA and UVB radiation (SPF 15 or higher).  Wear appropriate protective gear when playing sports and doing other activities. Gear may include a helmet, mouth guard, eye protection, wrist guards, and elbow and knee pads.  Be safe when driving or riding in vehicles. While driving: Wear a seat belt. Do not use your mobile device. Do not drink or use drugs.  Always be aware of your surroundings. Avoid risky situations or places where you feel unsafe.  Avoid relationships or friendships in which you do not feel respected. It is okay to ask for help from your parents or guardians, your health care provider, or other trusted adults like a Runner, broadcasting/film/video, coach, or counselor. This information is not intended to replace advice given to you by your health care provider. Make sure you discuss any questions you have with your health care provider. Document Revised: 11/14/2018 Document Reviewed: 01/04/2017 Elsevier Patient Education  2021 [employer].    Preventive Care 40-62 Years Old, Male Preventive care refers to lifestyle choices and visits with your health care provider that can promote health and wellness. At this stage in your life, you may start seeing a primary care physician instead of a pediatrician. It is important to take responsibility for your health and well-being. Preventive care for young adults includes:  A yearly physical exam. This is also called an annual wellness visit.  Regular dental and eye exams.  Immunizations.  Screening for certain conditions.  Healthy lifestyle choices, such as: ? Eating a healthy diet. ? Getting regular exercise. ? Not using drugs or products that contain nicotine and tobacco. ? Limiting alcohol use. What can I expect for my  preventive care visit? Physical exam Your health care provider may check your:  Height and weight. These may be used to calculate your BMI (body mass index). BMI is a measurement that tells if you are at a healthy weight.  Heart rate and blood pressure.  Body temperature.  Skin for abnormal spots. Counseling Your health care provider may ask you questions about your:  Past medical problems.  Family's medical history.  Alcohol, tobacco, and drug use.  Home life and relationship well-being.  Access to firearms.  Emotional well-being.  Diet, exercise, and sleep habits.  Sexual activity and sexual health. What immunizations do I need? Vaccines are usually given at various ages, according to a schedule. Your health care provider will recommend vaccines for you based on your age, medical history, and lifestyle or other factors, such as travel or where you work.   What tests do I need? Blood tests  Lipid and cholesterol levels. These may be checked every 5 years starting at age 24.  Hepatitis C test.  Hepatitis B test. Screening  Genital exam to check for testicular cancer or hernias.  STD (sexually transmitted disease) testing, if you are  at risk. Other tests  Tuberculosis skin test.  Vision and hearing tests.  Skin exam. Talk with your health care provider about your test results, treatment options, and if necessary, the need for more tests. Follow these instructions at home: Eating and drinking  Eat a healthy diet that includes fresh fruits and vegetables, whole grains, lean protein, and low-fat dairy products.  Drink enough fluid to keep your urine pale yellow.  Do not drink alcohol if: ? Your health care provider tells you not to drink. ? You are under the legal drinking age. In the [place] the legal drinking age is 33.  If you drink alcohol: ? Limit how much you use to 0-2 drinks a day. ? Be aware of how much alcohol is in your drink. In the [place] one  drink equals one 12 oz bottle of beer (355 mL), one 5 oz glass of wine (148 mL), or one 1 oz glass of hard liquor (44 mL).   Lifestyle  Take daily care of your teeth and gums. Brush your teeth every morning and night with fluoride toothpaste. Floss one time each day.  Stay active. Exercise for at least 30 minutes 5 or more days of the week.  Do not use any products that contain nicotine or tobacco, such as cigarettes, e-cigarettes, and chewing tobacco. If you need help quitting, ask your health care provider.  Do not use drugs.  If you are sexually active, practice safe sex. Use a condom or other form of protection to prevent STIs (sexually transmitted infections).  Find healthy ways to cope with stress, such as: ? Meditation, yoga, or listening to music. ? Journaling. ? Talking to a trusted person. ? Spending time with friends and family. Safety  Always wear your seat belt while driving or riding in a vehicle.  Do not drive: ? If you have been drinking alcohol. Do not ride with someone who has been drinking. ? When you are tired or distracted. ? While texting.  Wear a helmet and other protective equipment during sports activities.  If you have firearms in your house, make sure you follow all gun safety procedures.  Seek help if you have been bullied, physically abused, or sexually abused.  Use the Internet responsibly to avoid dangers, such as online bullying and online sex predators. What's next?  Go to your health care provider once a year for an annual wellness visit.  Ask your health care provider how often you should have your eyes and teeth checked.  Stay up to date on all vaccines. This information is not intended to replace advice given to you by your health care provider. Make sure you discuss any questions you have with your health care provider. Document Revised: 02/08/2019 Document Reviewed: 05/19/2018 Elsevier Patient Education  2021 Elsevier Inc.  Debrox  drops might help soften the wax that is left in the left ear, so that it doesn't get completely blocked again.

## 2020-11-06 ENCOUNTER — Ambulatory Visit (INDEPENDENT_AMBULATORY_CARE_PROVIDER_SITE_OTHER): Payer: BC Managed Care – PPO | Admitting: Family Medicine

## 2020-11-06 ENCOUNTER — Other Ambulatory Visit: Payer: Self-pay

## 2020-11-06 ENCOUNTER — Encounter: Payer: Self-pay | Admitting: Family Medicine

## 2020-11-06 ENCOUNTER — Encounter: Payer: Managed Care, Other (non HMO) | Admitting: Family Medicine

## 2020-11-06 VITALS — BP 110/70 | HR 68 | Ht 69.0 in | Wt 167.4 lb

## 2020-11-06 DIAGNOSIS — Z0000 Encounter for general adult medical examination without abnormal findings: Secondary | ICD-10-CM

## 2020-11-06 DIAGNOSIS — E559 Vitamin D deficiency, unspecified: Secondary | ICD-10-CM

## 2020-11-06 DIAGNOSIS — Z1159 Encounter for screening for other viral diseases: Secondary | ICD-10-CM | POA: Diagnosis not present

## 2020-11-06 DIAGNOSIS — B360 Pityriasis versicolor: Secondary | ICD-10-CM

## 2020-11-06 DIAGNOSIS — Z113 Encounter for screening for infections with a predominantly sexual mode of transmission: Secondary | ICD-10-CM | POA: Diagnosis not present

## 2020-11-06 LAB — POCT URINALYSIS DIP (PROADVANTAGE DEVICE)
Bilirubin, UA: NEGATIVE
Blood, UA: NEGATIVE
Glucose, UA: NEGATIVE mg/dL
Ketones, POC UA: NEGATIVE mg/dL
Leukocytes, UA: NEGATIVE
Nitrite, UA: NEGATIVE
Protein Ur, POC: NEGATIVE mg/dL
Specific Gravity, Urine: 1.015
Urobilinogen, Ur: NEGATIVE
pH, UA: 7 (ref 5.0–8.0)

## 2020-11-07 LAB — GC/CHLAMYDIA PROBE AMP
Chlamydia trachomatis, NAA: NEGATIVE
Neisseria Gonorrhoeae by PCR: NEGATIVE

## 2020-11-07 LAB — HEPATITIS C ANTIBODY: Hep C Virus Ab: <0.1 s/co ratio (ref 0.0–0.9)

## 2020-11-07 LAB — HIV ANTIBODY (ROUTINE TESTING W REFLEX): HIV Screen 4th Generation wRfx: NONREACTIVE

## 2020-11-07 LAB — VITAMIN D 25 HYDROXY (VIT D DEFICIENCY, FRACTURES): Vit D, 25-Hydroxy: 30.8 ng/mL (ref 30.0–100.0)

## 2020-11-07 LAB — RPR: RPR Ser Ql: NONREACTIVE

## 2020-11-13 ENCOUNTER — Telehealth: Payer: Self-pay | Admitting: Family Medicine

## 2020-11-13 NOTE — Telephone Encounter (Signed)
Requested records received from [facility]

## 2020-11-28 ENCOUNTER — Encounter: Payer: Managed Care, Other (non HMO) | Admitting: Family Medicine

## 2021-03-18 ENCOUNTER — Other Ambulatory Visit: Payer: Self-pay | Admitting: Family Medicine

## 2021-03-18 DIAGNOSIS — R059 Cough, unspecified: Secondary | ICD-10-CM

## 2021-03-19 ENCOUNTER — Telehealth: Payer: Self-pay | Admitting: *Deleted

## 2021-03-19 NOTE — Telephone Encounter (Signed)
Mom called and said patient has the same cough he gets every since he was a kid. Has not done any home covid tests because he gets this every year. She wants you to go back and look at Dr. Carlyle Lipa notes. She is asking for tessalon to be sent in, she took him his nebulizer. I offered her a virtual visit, and suggested perhaps UC at [facility] since he is there. Please advise.

## 2021-03-19 NOTE — Telephone Encounter (Signed)
We cannot prescribe cough medications without evaluating him.  I recommend him taking Mucinex DM 12 hour, taking it twice daily.  I see a history of childhood asthma, so if she has nebulizer meds, that may also help.  This time of year allergies also flare, which can contribute to cough, so make sure that he is taking his claritin daily, especially if having any postnasal drainage or congestion. Student health or UC would be recommended if not improving, for evaluation.

## 2021-03-19 NOTE — Telephone Encounter (Signed)
Patient's mom, Gwen advised.

## 2021-11-10 ENCOUNTER — Encounter: Payer: BC Managed Care – PPO | Admitting: Family Medicine

## 2021-11-16 NOTE — Progress Notes (Unsigned)
No chief complaint on file.   Craig Marquez is a 21 y.o. male who presents for a complete physical.     Vitamin D deficiency: level was low at 21.2 in 10/2019, when he hadn't taken supplements in a couple of months. He was treated with 8 weeks of prescription therapy.  Recheck in 04/2020 was normal at 30.4, after taking daily D3, but had stopped a few weeks prior to the test. It was 30.8 in 11/2020 when taking 2000 IU daily, some occasional missed doses. He is currently taking  He had issues in 04/2020 with anxiety, decreased appetite, fatigue. He had a lot of school stress at that time, as well as a relationship break-up. At that time:  PHQ-9 score of 10, GAD-7 score of 7. Counseling was encouraged. He felt better after talking to a friend, never contacted counseling center. He worked with a Psychologist, counselling last summer.  Had been pre-med prior to that (though got a D in a chem class). Changed major??? UPDATE  H/o childhood asthma.   He uses nebulizer rarely, when sick.  (Last year with sinus infection, UPDATE) Denies any concern now, even with exercise.  Sees dermatologist, treated for acne, tinea versicolor. Uses the TAC for bumps on back of his scalp.  Immunization History  Administered Date(s) Administered   DTaP 09/13/2000, 11/11/2000, 01/18/2001, 01/20/2002, 07/18/2004   HPV Quadrivalent 08/12/2012, 05/31/2013, 08/25/2013   Hepatitis A 02/23/2004, 08/16/2007   Hepatitis B 07-May-2001, 08/12/2000, 04/22/2001   HiB (PRP-OMP) 09/13/2000, 11/11/2000, 01/18/2001, 07/30/2003   IPV 09/13/2000, 11/11/2000, 01/18/2001, 07/18/2004   Influenza,inj,Quad PF,6+ Mos 05/06/2020, 03/08/2021   Influenza-Unspecified 04/23/2004, 08/16/2007, 04/23/2008, 03/28/2010, 05/29/2011, 03/25/2012, 08/25/2013, 03/09/2014, 03/08/2015, 05/01/2018   MMR 07/29/2001, 07/18/2004   Meningococcal Conjugate 10/21/2016, 10/21/2016   PFIZER(Purple Top)SARS-COV-2 Vaccination 11/02/2019, 11/23/2019, 05/28/2020   Pneumococcal  Conjugate-13 09/13/2000, 12/01/2000, 12/24/2000, 01/20/2002   Pneumococcal Polysaccharide-23 08/12/2012   Tdap 05/29/2011, 11/11/2018   Varicella 07/29/2001, 02/22/2006   Dentist: twice yearly Ophtho: has glasses, yearly Exercise:  gym 4x/week--weights, stairmaster and elliptical. Also some pickup basketball at school (infrequent). Alcohol:  Fridays and Saturdays while in college (2-3 drinks (vodka) at a time).  6 lifetime sexual partners. Using condoms regularly (only once without) 4-5 hours of screentime daily. Sleeps 8 hours/night.  Lipid screen: Lab Results  Component Value Date   CHOL 171 (H) 11/02/2019   HDL 49 11/02/2019   LDLCALC 113 (H) 11/02/2019   TRIG 43 11/02/2019   CHOLHDL 3.5 11/02/2019    PMH, PSH, SH, FH reviewed    ROS:  The patient denies anorexia, fever, headaches,  vision loss, decreased hearing, ear pain, chest pain, palpitations, dizziness, syncope, dyspnea on exertion, cough, swelling, nausea, vomiting, diarrhea, constipation, abdominal pain, melena, hematochezia, indigestion/heartburn, hematuria, incontinence, erectile dysfunction, nocturia, weakened urine stream, dysuria, genital lesions, joint pains, numbness, tingling, weakness, tremor, suspicious skin lesions, depression, anxiety, abnormal bleeding/bruising, or enlarged lymph nodes No changes to birthmarks on trunk (size/shape or number) Rash on chest (tinea versicolor), resolving with treatment from dermatologist.   PHYSICAL EXAM:  There were no vitals taken for this visit.  Wt Readings from Last 3 Encounters:  11/06/20 167 lb 6.4 oz (75.9 kg)  10/30/20 169 lb (76.7 kg)  05/20/20 166 lb 9.6 oz (75.6 kg) (66 %, Z= 0.42)*   * Growth percentiles are based on CDC (Boys, 2-20 Years) data.    General Appearance:    Alert, cooperative, no distress, appears stated age  Head:    Normocephalic, without obvious abnormality, atraumatic  Eyes:  PERRL, conjunctiva/corneas clear, EOM's intact, fundi     benign  Ears:    Normal TM's and external ear canals  Nose:   Normal external appearance  Throat:   Normal oropharynx without lesions or erythema  Neck:   Supple, no lymphadenopathy;  thyroid:  no enlargement/ tenderness/nodules; no carotid bruit or JVD  Back:    Spine nontender, no curvature, ROM normal, no CVA     tenderness  Lungs:     Clear to auscultation bilaterally without wheezes, rales or     ronchi; respirations unlabored  Chest Wall:    No tenderness or deformity   Heart:    Regular rate and rhythm, S1 and S2 normal, no murmur, rub   or gallop  Breast Exam:    No chest wall tenderness, masses or gynecomastia  Abdomen:     Soft, non-tender, nondistended, normoactive bowel sounds,    no masses, no hepatosplenomegaly  Genitalia:    Normal male external genitalia, circumsized, without lesions.  Testicles without masses.  No inguinal hernias.  Rectal:   Deferred due to age <40 and lack of symptoms  Extremities:   No clubbing, cyanosis or edema  Pulses:   2+ and symmetric all extremities  Skin:   Skin color, texture, turgor normal, no rashes or lesions. 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   Lymph nodes:   Cervical, supraclavicular, and  Inguinal nodes normal  Neurologic:   Normal strength, sensation and gait; reflexes 2+ and symmetric throughout            Psych:   Normal mood, affect, hygiene and grooming.    UPDATE SKIN   ASSESSMENT/PLAN:   Enter if he got COVID bivalent booster Please double-check [facility] and confirm dates of meningitis conjugate vaccine (it is listed twice, with the same date, rather than 2 separate vaccines). It doesn't appear that he ever got meningitis B series.  Notes from Dr. Collene Gobble last CPE reviewed in 2020, only given Tdap booster at that visit.  Consider STD testing, if appropriate, and Vit D if noncompliant   Discussed at least 30 minutes of aerobic activity at least 5 days/week, weight-bearing  exercise at least 2x/week; proper sunscreen use reviewed; healthy diet and alcohol recommendations reviewed; regular seatbelt use; safe sex (condoms, Plan B), alcohol/drug/tobacco counseling.  Immunization recommendations discussed--continue yearly flu shots.  Updated COVID vaccine in the Fall, when available. Discussed Meningitis B.

## 2021-11-17 ENCOUNTER — Encounter: Payer: Self-pay | Admitting: Family Medicine

## 2021-11-17 ENCOUNTER — Ambulatory Visit (INDEPENDENT_AMBULATORY_CARE_PROVIDER_SITE_OTHER): Payer: 59 | Admitting: Family Medicine

## 2021-11-17 VITALS — BP 110/80 | HR 72 | Ht 68.0 in | Wt 174.0 lb

## 2021-11-17 DIAGNOSIS — E559 Vitamin D deficiency, unspecified: Secondary | ICD-10-CM | POA: Diagnosis not present

## 2021-11-17 DIAGNOSIS — S90221A Contusion of right lesser toe(s) with damage to nail, initial encounter: Secondary | ICD-10-CM

## 2021-11-17 DIAGNOSIS — Z0000 Encounter for general adult medical examination without abnormal findings: Secondary | ICD-10-CM | POA: Diagnosis not present

## 2021-11-17 NOTE — Patient Instructions (Signed)
Try and be consistent with the Vitamin D. It is fine to double up when you realize that forgot it the day before.

## 2021-11-19 LAB — GC/CHLAMYDIA PROBE AMP
Chlamydia trachomatis, NAA: NEGATIVE
Neisseria Gonorrhoeae by PCR: NEGATIVE

## 2022-01-06 DIAGNOSIS — K602 Anal fissure, unspecified: Secondary | ICD-10-CM

## 2022-01-06 HISTORY — DX: Anal fissure, unspecified: K60.2

## 2022-01-22 ENCOUNTER — Ambulatory Visit (INDEPENDENT_AMBULATORY_CARE_PROVIDER_SITE_OTHER): Payer: 59 | Admitting: Family Medicine

## 2022-01-22 ENCOUNTER — Encounter: Payer: Self-pay | Admitting: Family Medicine

## 2022-01-22 VITALS — BP 114/72 | HR 68 | Ht 68.0 in | Wt 172.0 lb

## 2022-01-22 DIAGNOSIS — K602 Anal fissure, unspecified: Secondary | ICD-10-CM | POA: Diagnosis not present

## 2022-01-22 DIAGNOSIS — L700 Acne vulgaris: Secondary | ICD-10-CM | POA: Diagnosis not present

## 2022-01-22 MED ORDER — TRETINOIN 0.025 % EX CREA: 1.0000 "application " | TOPICAL_CREAM | Freq: Every day | CUTANEOUS | 5 refills | Status: AC

## 2022-01-22 NOTE — Patient Instructions (Signed)
Be sure to drink more water and eat a high fiber diet. You can temporarily use some stool softeners (colace). This should heal up pretty quickly as long as the stools are soft, and narrower.  The bumps in the scrotal area are likely small cysts (vs ingrown hairs). Warm compresses to the area should help these heal. If they get larger more painful, it might require re-evaluation and/or antibiotics

## 2022-01-22 NOTE — Progress Notes (Signed)
Chief Complaint  Patient presents with   Rectal Bleeding    Noticed blood in his stool (bright red) 4 days ago. Has one bowel movement daily and has been blood each time. Has been some pressure when he is having a bowel movement, no pain or abdominal pain otherwise. Has been eating more protein and red meats lately.    Medication Refill    Asked if you could refill his retin-A, originally from derm.    Patient presents with complaint of BRBPR. Stool was a little hard, strained some, had some discomfort with passage of stool, and noted BRB. He reports he saw drips of blood in the toilet water, BRB on the toilet paper, and on the outside of the stool.  First noted 4 days ago.  Doesn't notice any blood when the stool is softer. Has occurred 3 times in the last 4 days.  Acne--prescribed retin-A in 10/2020 by derm.  Has been out for a couple of months.  Slight recurrence (around beard area). Would like to restart.  Had gotten other meds for chest/back (tinea versicolor), resolved.  He also reports concern about bumps on his scrotum. He noted these bilaterally over the last couple of weeks. Thinks they are getting smaller. He is in a sexual relationship, sometimes uses condoms.  Monogamous, girlfriend is on contraception.  PMH, PSH, SH reviewed  No FH IBD, colon cancer  Outpatient Encounter Medications as of 01/22/2022  Medication Sig Note   loratadine (CLARITIN) 10 MG tablet Take 10 mg by mouth daily.    [DISCONTINUED] tretinoin (RETIN-A) 0.025 % cream Apply 1 application topically at bedtime.    ketoconazole (NIZORAL) 2 % shampoo Apply 1 application topically 3 (three) times a week. (Patient not taking: Reported on 11/17/2021) 11/17/2021: Uses prn with flares of tinea versicolor   tretinoin (RETIN-A) 0.025 % cream Apply 1 application  topically at bedtime.    triamcinolone ointment (KENALOG) 0.1 % Apply topically 2 (two) times daily. (Patient not taking: Reported on 01/22/2022)    Vitamin D,  Ergocalciferol, 50 MCG (2000 UT) CAPS Take 1 capsule by mouth daily. (Patient not taking: Reported on 01/22/2022) 11/17/2021: Remembers to take about 4x/week   No facility-administered encounter medications on file as of 01/22/2022.   No Known Allergies  ROS:  no fever, chills, URI symptoms. No urinary complaints. Skin concerns per HPI.  Denies abdominal pain, n/v/d. Mild straining and BRB per HPI. Moods are good. Goes back to school tomorrow.   PHYSICAL EXAM:  BP 114/72   Pulse 68   Ht 5\' 8"  (1.727 m)   Wt 172 lb (78 kg)   BMI 26.15 kg/m   Well-appearing, pleasant male, in good spirits HEENT: conjunctiva and sclera are clear, EOMI Abdomen: soft, nontender Rectum: no hemorrhoids noted.  There is some erythema and tenderness posteriorly at anus, c/w fissure. No active bleeding. Scrotum--some slightly indurated/thickened skin at posterior scrotum on the R (cyst vs ingrown hair), 2 smaller areas on the left. Psych: normal mood, affect, hygiene and grooming Neuro: alert and oriented, cranial nerves intact. Normal gait.   ASSESSMENT/PLAN:  Anal fissure - discussed water, fiber, stool softener. If persistent bleeding or discomfort, can use topical meds (CCB vs lido). Pt reassurred.  Acne vulgaris - mild. refilled retin-A - Plan: tretinoin (RETIN-A) 0.025 % cream  Mild cysts on scrotum--not infected. F/u if increasing in size, pain Recommended warm compresses.   Safe sex recommended.   I spent 30 minutes dedicated to the care of this patient, including  pre-visit review of records, face to face time, post-visit ordering of testing and documentation.

## 2022-02-11 ENCOUNTER — Encounter: Payer: Self-pay | Admitting: Internal Medicine

## 2022-03-17 ENCOUNTER — Encounter: Payer: Self-pay | Admitting: Internal Medicine

## 2022-04-08 ENCOUNTER — Encounter: Payer: Self-pay | Admitting: Family Medicine

## 2022-04-08 ENCOUNTER — Telehealth (INDEPENDENT_AMBULATORY_CARE_PROVIDER_SITE_OTHER): Payer: 59 | Admitting: Family Medicine

## 2022-04-08 VITALS — HR 69 | Ht 68.0 in | Wt 176.0 lb

## 2022-04-08 DIAGNOSIS — G4700 Insomnia, unspecified: Secondary | ICD-10-CM | POA: Diagnosis not present

## 2022-04-08 NOTE — Progress Notes (Signed)
Start time:4:47 End time: 5:08  Virtual Visit via Video Note  I connected with Craig Marquez. on 04/08/22 by a video enabled telemedicine application and verified that I am speaking with the correct person using two identifiers.  Location: Patient: home Provider: office   I discussed the limitations of evaluation and management by telemedicine and the availability of in person appointments. The patient expressed understanding and agreed to proceed.  History of Present Illness:  Chief Complaint  Patient presents with   Insomnia    VIRTUAL having issues falling asleep. Friend uses magnesium glycinate and he wants to know if you recommend this or have another recommendation.    Having trouble falling asleep for the last 2 weeks. Tried putting away his phone, no blue lights, these changes have helped some. Not falling asleep until 1:30-2am.  Goes to bed around 11-11:30. Goes to the gym 8:30-9, eats dinner after. Only rarely uses a pre-workout energy supplement. No soda, coffee tea. Drinks Gatorade or body armor with dinner.  Used some mucinex-DM recently for his cough. Denies any sudafed/decongestant use.  Sleeps fine once he gets to sleep. Busy at school, not aware of mind racing, or trouble shutting the brain down.  Does admit to taking some naps in the afternoon.  Has melatonin--tries not to use it, afraid of getting dependent.  10mg  dose.  PMH, PSH, SH reviewed  Outpatient Encounter Medications as of 04/08/2022  Medication Sig Note   loratadine (CLARITIN) 10 MG tablet Take 10 mg by mouth daily.    tretinoin (RETIN-A) 0.025 % cream Apply 1 application  topically at bedtime.    triamcinolone ointment (KENALOG) 0.1 % Apply topically 2 (two) times daily.    Vitamin D, Ergocalciferol, 50 MCG (2000 UT) CAPS Take 1 capsule by mouth daily. 11/17/2021: Remembers to take about 4x/week   ketoconazole (NIZORAL) 2 % shampoo Apply 1 application topically 3 (three) times a week.  (Patient not taking: Reported on 11/17/2021) 11/17/2021: Uses prn with flares of tinea versicolor   No facility-administered encounter medications on file as of 04/08/2022.   No Known Allergies  ROS: denies fever, chills.  Recent cough, resolved.  No chest pain. No depression.  Some stress, but no significant anxiety. Insomnia per HPI.    Observations/Objective:  Pulse 69   Ht 5\' 8"  (1.727 m)   Wt 176 lb (79.8 kg)   BMI 26.76 kg/m   Well-appearing, pleasant male, in good spirits, in no distress. Normal eye contact, speech, mood, affect, grooming He is alert and oriented, cranial nerves grossly intact. Exam is limited due to virtual nature of the visit.  Assessment and Plan:  Insomnia, unspecified type - initial insomnia. Reviewed sleep hygiene in detail (exercise/eat earlier, limit naps, OTC meds such as melatonin, benadryl prn)   Follow Up Instructions:    I discussed the assessment and treatment plan with the patient. The patient was provided an opportunity to ask questions and all were answered. The patient agreed with the plan and demonstrated an understanding of the instructions.   The patient was advised to call back or seek an in-person evaluation if the symptoms worsen or if the condition fails to improve as anticipated.  I spent 25 minutes dedicated to the care of this patient, including pre-visit review of records, face to face time, post-visit ordering of testing and documentation.    Vikki Ports, MD

## 2022-05-25 ENCOUNTER — Encounter: Payer: Self-pay | Admitting: Family Medicine

## 2022-05-25 ENCOUNTER — Ambulatory Visit (INDEPENDENT_AMBULATORY_CARE_PROVIDER_SITE_OTHER): Payer: 59 | Admitting: Family Medicine

## 2022-05-25 VITALS — BP 110/70 | HR 72 | Temp 97.4°F | Ht 68.0 in | Wt 181.0 lb

## 2022-05-25 DIAGNOSIS — Z23 Encounter for immunization: Secondary | ICD-10-CM

## 2022-05-25 DIAGNOSIS — R229 Localized swelling, mass and lump, unspecified: Secondary | ICD-10-CM | POA: Diagnosis not present

## 2022-05-25 NOTE — Progress Notes (Signed)
Chief Complaint  Patient presents with   Mass    Bump on back of head since Thursday, has gone down in size. Not weeping, under the skin-painful to touch.     Woke up 12/14 with a painful bump on the back of his head (centrally). He reports it was much larger (showed photo on phone), and that it has since decreased in size (without any treatment). Never had any drainage.  It is less sore than it had been.  No recent hair cut or shaving of his hair/head.  PMH, PSH, SH reviewed  Outpatient Encounter Medications as of 05/25/2022  Medication Sig Note   loratadine (CLARITIN) 10 MG tablet Take 10 mg by mouth daily.    MAGNESIUM CITRATE PO Take 1 tablet by mouth daily.    tretinoin (RETIN-A) 0.025 % cream Apply 1 application  topically at bedtime.    triamcinolone ointment (KENALOG) 0.1 % Apply topically 2 (two) times daily.    Vitamin D, Ergocalciferol, 50 MCG (2000 UT) CAPS Take 1 capsule by mouth daily. 11/17/2021: Remembers to take about 4x/week   ketoconazole (NIZORAL) 2 % shampoo Apply 1 application topically 3 (three) times a week. (Patient not taking: Reported on 11/17/2021) 11/17/2021: Uses prn with flares of tinea versicolor   No facility-administered encounter medications on file as of 05/25/2022.   (Uses Mg for "overall health", helps him sleep)  No Known Allergies  ROS: no fever, chills, headaches, dizziness, cold or allergy symptoms. No other skin concerns, rashes, or other lumps.  PHYSICAL EXAM:  BP 110/70   Pulse 72   Temp (!) 97.4 F (36.3 C) (Tympanic)   Ht 5\' 8"  (1.727 m)   Wt 181 lb (82.1 kg)   BMI 27.52 kg/m   Wt Readings from Last 3 Encounters:  05/25/22 181 lb (82.1 kg)  04/08/22 176 lb (79.8 kg)  01/22/22 172 lb (78 kg)   Well-appearing, pleasant male, in good spirits HEENT: conjunctiva and sclera are clear, EOMI. Posterior lower scalp, centrally, there is a firm area measuring just under 1 cm in size (Photo on phone was about 4 times as large.) No  fluctuance, erythema or tenderness. No crusting or drainage Neck: no lymphadenopathy or mass Heart: regular rate and rhythm Lungs: clear bilaterally   ASSESSMENT/PLAN:  Localized skin mass, lump, or swelling - posterior lower scalp.  Based on larger size and rapid resolution, suspect ingrown hair or cyst, self-resolving. Heat prn, f/u if worse  Need for influenza vaccination - Plan: Flu Vaccine QUAD 6+ mos PF IM (Fluarix Quad PF)  Need for COVID-19 vaccine - Plan: Pfizer Fall 2023 Covid-19 Vaccine 66yrs and older

## 2022-06-08 HISTORY — PX: WISDOM TOOTH EXTRACTION: SHX21

## 2022-11-18 ENCOUNTER — Encounter: Payer: Self-pay | Admitting: *Deleted

## 2022-11-19 ENCOUNTER — Encounter: Payer: 59 | Admitting: Family Medicine

## 2022-12-30 NOTE — Patient Instructions (Incomplete)
  HEALTH MAINTENANCE RECOMMENDATIONS:  It is recommended that you get at least 30 minutes of aerobic exercise at least 5 days/week (for weight loss, you may need as much as 60-90 minutes). This can be any activity that gets your heart rate up. This can be divided in 10-15 minute intervals if needed, but try and build up your endurance at least once a week.  Weight bearing exercise is also recommended twice weekly.  Eat a healthy diet with lots of vegetables, fruits and fiber.  "Colorful" foods have a lot of vitamins (ie green vegetables, tomatoes, red peppers, etc).  Limit sweet tea, regular sodas and alcoholic beverages, all of which has a lot of calories and sugar.  Up to 2 alcoholic drinks daily may be beneficial for men (unless trying to lose weight, watch sugars).  Drink a lot of water.  Sunscreen of at least SPF 30 should be used on all sun-exposed parts of the skin when outside between the hours of 10 am and 4 pm (not just when at beach or pool, but even with exercise, golf, tennis, and yard work!)  Use a sunscreen that says "broad spectrum" so it covers both UVA and UVB rays, and make sure to reapply every 1-2 hours.  Remember to change the batteries in your smoke detectors when changing your clock times in the spring and fall.  Carbon monoxide detectors are recommended for your home.  Use your seat belt every time you are in a car, and please drive safely and not be distracted with cell phones and texting while driving.  Please get yearly flu shots in the Fall. Get the updated COVID booster when it becomes available in the Fall.  Please increase your vitamin D supplement to taking it EVERY day from October through March.

## 2022-12-30 NOTE — Progress Notes (Unsigned)
No chief complaint on file.   Craig Marquez. is a 22 y.o. male who presents for a complete physical.    He was seen in November for virtual visit for insomnia. Discussed sleep hygiene, exercise/eat earlier, limit naps, OTC meds such as melatonin, benadryl prn. UPDATE  Seen 01/2022 with anal fissure.  He denies any further painful bowel movements or BRBPR.    Vitamin D deficiency: Last level was 30.8 in 11/2020, when taking 2000 IU about 4x/week.  Prior level was low at 21.2 in 10/2019, when he hadn't taken supplements in a couple of months.  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   He uses nebulizer or inhaler, rarely, when sick.   Hasn't needed to use this in the last year. Denies any concern now, even with exercise.  Sees dermatologist, treated for acne, tinea versicolor. Uses the TAC for bumps on back of his scalp. His tinea versicolor did very well, only uses the shampoo prn for flares.  Immunization History  Administered Date(s) Administered   COVID-19, mRNA, vaccine(Comirnaty)12 years and older 05/25/2022   DTaP 09/13/2000, 11/11/2000, 01/18/2001, 01/20/2002, 07/18/2004   HIB (PRP-OMP) 09/13/2000, 11/11/2000, 01/18/2001, 07/30/2003   HPV Quadrivalent 08/12/2012, 05/31/2013, 08/25/2013   Hepatitis A 02/23/2004, 08/16/2007   Hepatitis B 2001/04/19, 08/12/2000, 04/22/2001   IPV 09/13/2000, 11/11/2000, 01/18/2001, 07/18/2004   Influenza,inj,Quad PF,6+ Mos 05/06/2020, 03/08/2021, 05/25/2022   Influenza-Unspecified 04/23/2004, 08/16/2007, 04/23/2008, 03/28/2010, 05/29/2011, 03/25/2012, 08/25/2013, 03/09/2014, 03/08/2015, 05/01/2018   MMR 07/29/2001, 07/18/2004   MenQuadfi_Meningococcal Groups ACYW Conjugate 08/25/2013, 10/25/2017   Meningococcal Conjugate 10/21/2016   Meningococcal polysaccharide vaccine (MPSV4) 10/21/2016    PFIZER(Purple Top)SARS-COV-2 Vaccination 11/02/2019, 11/23/2019, 05/28/2020   Pneumococcal Conjugate-13 09/13/2000, 12/01/2000, 12/24/2000, 01/20/2002   Pneumococcal Polysaccharide-23 08/12/2012   Tdap 05/29/2011, 11/11/2018   Varicella 07/29/2001, 02/22/2006    Dentist: twice yearly Ophtho: has glasses, yearly Exercise:  gym 4x/week--weights, stairmaster and elliptical. Also some pickup basketball at school (infrequent). Alcohol:  1 drink/week (cut back, didn't go out as much this year).  7 lifetime sexual partners. In a current relationship, using condoms. UPDATE Sleeps 8 hours/night.  Lipid screen: Lab Results  Component Value Date   CHOL 171 (H) 11/02/2019   HDL 49 11/02/2019   LDLCALC 113 (H) 11/02/2019   TRIG 43 11/02/2019   CHOLHDL 3.5 11/02/2019    PMH, PSH, SH, FH reviewed    ROS:  The patient denies anorexia, fever, headaches,  vision loss, decreased hearing, ear pain, chest pain, palpitations, dizziness, syncope, dyspnea on exertion, cough, swelling, nausea, vomiting, diarrhea, constipation, abdominal pain, melena, hematochezia, indigestion/heartburn, hematuria, incontinence, erectile dysfunction, nocturia, weakened urine stream, dysuria, genital lesions, joint pains, numbness, tingling, weakness, tremor, suspicious skin lesions, depression, anxiety, abnormal bleeding/bruising, or enlarged lymph nodes  No changes to birthmarks on trunk (size/shape or number) No further BRBPR   PHYSICAL EXAM:  There were no vitals taken for this visit.  Wt Readings from Last 3 Encounters:  05/25/22 181 lb (82.1 kg)  04/08/22 176 lb (79.8 kg)  01/22/22 172 lb (78 kg)    General Appearance:    Alert, cooperative, no distress, appears stated age  Head:    Normocephalic, without obvious abnormality, atraumatic  Eyes:    PERRL, conjunctiva/corneas clear, EOM's intact, fundi    benign  Ears:    Normal TM's and external ear canals  Nose:  Normal external appearance  Throat:    Normal oropharynx without lesions or erythema  Neck:   Supple, no lymphadenopathy;  thyroid:  no enlargement/ tenderness/nodules; no carotid bruit or JVD  Back:    Spine nontender, no curvature, ROM normal, no CVA     tenderness  Lungs:     Clear to auscultation bilaterally without wheezes, rales or     ronchi; respirations unlabored  Chest Wall:    No tenderness or deformity   Heart:    Regular rate and rhythm, S1 and S2 normal, no murmur, rub   or gallop  Breast Exam:    No chest wall tenderness, masses or gynecomastia  Abdomen:     Soft, non-tender, nondistended, normoactive bowel sounds,    no masses, no hepatosplenomegaly  Genitalia:    Normal male external genitalia, circumsized, without lesions.  Testicles without masses.  No inguinal hernias.  Rectal:   Deferred due to age <40 and lack of symptoms  Extremities:   No clubbing, cyanosis or edema  Pulses:   2+ and symmetric all extremities  Skin:   Skin color, texture, turgor normal, no rashes or lesions. 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  0.5 cm area of hyperpigmentation at the lateral aspect of the right great toenail.  It is at the distal 1/2-1/3 of the toenail. Faded, lacy hypopigmentation across back  Lymph nodes:   Cervical, supraclavicular, and  Inguinal nodes normal  Neurologic:   Normal strength, sensation and gait; reflexes 2+ and symmetric throughout            Psych:   Normal mood, affect, hygiene and grooming.    ***UPDATE SKIN  Exam from 01/2022: UPDATE Scrotum--some slightly indurated/thickened skin at posterior scrotum on the R (cyst vs ingrown hair), 2 smaller areas on the left.    ASSESSMENT/PLAN:  I THINK MENINGITIS VACCINES ARE WRONG?  GOT 2 DIFFERENT MENINGITIS ON THE SAME DAY IN 2018??? DID HE HAVE MEN B VACCINES?  I'M NOT SURE WHAT EXACTLY HE GOT... VERY CONFUSED  ?need for STD testing? Urine for GC/chlamydia, +/- blood tests, if new partner/unprotected  sex   Discussed at least 30 minutes of aerobic activity at least 5 days/week, weight-bearing exercise at least 2x/week; proper sunscreen use reviewed; healthy diet and alcohol recommendations reviewed; regular seatbelt use; safe sex (condoms, Plan B), alcohol/drug/tobacco counseling.  Immunization recommendations discussed--continue yearly flu shots.  Recommended updated COVID vaccine in the Fall, when available.  F/u 1 year, sooner prn

## 2022-12-31 ENCOUNTER — Ambulatory Visit (INDEPENDENT_AMBULATORY_CARE_PROVIDER_SITE_OTHER): Payer: Managed Care, Other (non HMO) | Admitting: Family Medicine

## 2022-12-31 ENCOUNTER — Encounter: Payer: Self-pay | Admitting: Family Medicine

## 2022-12-31 VITALS — BP 118/70 | HR 64 | Ht 68.0 in | Wt 181.8 lb

## 2022-12-31 DIAGNOSIS — E559 Vitamin D deficiency, unspecified: Secondary | ICD-10-CM

## 2022-12-31 DIAGNOSIS — Z0000 Encounter for general adult medical examination without abnormal findings: Secondary | ICD-10-CM

## 2022-12-31 DIAGNOSIS — M795 Residual foreign body in soft tissue: Secondary | ICD-10-CM | POA: Diagnosis not present

## 2022-12-31 DIAGNOSIS — G4700 Insomnia, unspecified: Secondary | ICD-10-CM | POA: Diagnosis not present

## 2022-12-31 DIAGNOSIS — L700 Acne vulgaris: Secondary | ICD-10-CM

## 2022-12-31 LAB — POCT URINALYSIS DIP (PROADVANTAGE DEVICE)
Bilirubin, UA: NEGATIVE
Blood, UA: NEGATIVE
Glucose, UA: NEGATIVE mg/dL
Leukocytes, UA: NEGATIVE
Nitrite, UA: NEGATIVE
Protein Ur, POC: NEGATIVE mg/dL
Specific Gravity, Urine: 1.025
Urobilinogen, Ur: 0.2
pH, UA: 6 (ref 5.0–8.0)

## 2023-12-21 ENCOUNTER — Encounter: Payer: Self-pay | Admitting: *Deleted

## 2023-12-21 ENCOUNTER — Encounter: Payer: Self-pay | Admitting: Family Medicine

## 2023-12-21 NOTE — Patient Instructions (Incomplete)

## 2023-12-21 NOTE — Progress Notes (Unsigned)
 No chief complaint on file.   Craig Marquez. is a 23 y.o. male who presents for a complete physical.      Vitamin D  deficiency: Last level was 30.8 in 11/2020, when taking 2000 IU about 4x/week.  Prior level was low at 21.2 in 10/2019, when he hadn't taken supplements in a couple of months.  He is currently taking 2000  IU 4x/week.  H/o childhood asthma.   He previously used nebulizer or inhaler only when sick, hasn't needed in a few years.  Denies any wheezing or shortness of breath, no issues with exercise either.  Previously has seen dermatologist, treated for acne, tinea versicolor. Hasn't needed medications recently. Uses TAC for bumps on back of his scalp. 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   Immunization History  Administered Date(s) Administered   DTaP 09/13/2000, 11/11/2000, 01/18/2001, 01/20/2002, 07/18/2004   HIB (PRP-OMP) 09/13/2000, 11/11/2000, 01/18/2001, 07/30/2003   HPV Quadrivalent 08/12/2012, 05/31/2013, 08/25/2013   Hepatitis A 02/23/2004, 08/16/2007   Hepatitis B 2001/06/04, 08/12/2000, 04/22/2001   IPV 09/13/2000, 11/11/2000, 01/18/2001, 07/18/2004   Influenza,inj,Quad PF,6+ Mos 05/06/2020, 03/08/2021, 05/25/2022   Influenza-Unspecified 04/23/2004, 08/16/2007, 04/23/2008, 03/28/2010, 05/29/2011, 03/25/2012, 08/25/2013, 03/09/2014, 03/08/2015, 05/01/2018   MMR 07/29/2001, 07/18/2004   MenQuadfi_Meningococcal Groups ACYW Conjugate 08/25/2013, 10/21/2016   Meningococcal B, OMV 10/21/2016, 10/25/2017   PFIZER(Purple Top)SARS-COV-2 Vaccination 11/02/2019, 11/23/2019, 05/28/2020   Pfizer(Comirnaty)Fall Seasonal Vaccine 12 years and older 05/25/2022   Pneumococcal Conjugate-13 09/13/2000, 12/01/2000, 12/24/2000, 01/20/2002   Pneumococcal Polysaccharide-23 08/12/2012   Tdap 05/29/2011, 11/11/2018   Varicella 07/29/2001, 02/22/2006   Dentist: twice yearly Ophtho: has glasses, yearly Exercise: gym 3-4x/week--weights,  stairmaster and elliptical. Alcohol:  1-2 drink/week (after work)  7 lifetime sexual partners. In a current relationship, using condoms  most of the time.  Same partner as last year. ***UPDATE  Lipid screen: Lab Results  Component Value Date   CHOL 171 (H) 11/02/2019   HDL 49 11/02/2019   LDLCALC 113 (H) 11/02/2019   TRIG 43 11/02/2019   CHOLHDL 3.5 11/02/2019    PMH, PSH, SH, FH reviewed    ROS:  The patient denies anorexia, fever, headaches,  vision loss, decreased hearing, ear pain, chest pain, palpitations, dizziness, syncope, dyspnea on exertion, cough, swelling, nausea, vomiting, diarrhea, constipation, abdominal pain, melena, hematochezia, indigestion/heartburn, hematuria, incontinence, erectile dysfunction, nocturia, weakened urine stream, dysuria, genital lesions, joint pains, numbness, tingling, weakness, tremor, suspicious skin lesions, depression, anxiety, abnormal bleeding/bruising, or enlarged lymph nodes  Some ear plugging, thinks might have wax.  Uses ear buds at work. No changes to birthmarks on trunk (size/shape or number) No further BRBPR Some insomnia recently. Denies depression. Stress, but no major anxiety.    PHYSICAL EXAM:  There were no vitals taken for this visit.  Wt Readings from Last 3 Encounters:  12/31/22 181 lb 12.8 oz (82.5 kg)  05/25/22 181 lb (82.1 kg)  04/08/22 176 lb (79.8 kg)    General Appearance:    Alert, cooperative, no distress, appears stated age  Head:    Normocephalic, without obvious abnormality, atraumatic  Eyes:    PERRL, conjunctiva/corneas clear, EOM's intact, fundi    benign  Ears:    Normal TM's and external ear canals  Nose:   Normal external appearance  Throat:   Normal oropharynx without lesions or erythema  Neck:   Supple, no lymphadenopathy;  thyroid:  no enlargement/ tenderness/nodules; no carotid bruit or JVD  Back:    Spine nontender, no  curvature, ROM normal, no CVA     tenderness  Lungs:     Clear to  auscultation bilaterally without wheezes, rales or     ronchi; respirations unlabored  Chest Wall:    No tenderness or deformity   Heart:    Regular rate and rhythm, S1 and S2 normal, no murmur, rub   or gallop  Breast Exam:    No chest wall tenderness, masses or gynecomastia  Abdomen:     Soft, non-tender, nondistended, normoactive bowel sounds,    no masses, no hepatosplenomegaly  Genitalia:    Normal male external genitalia, circumsized, without lesions.  Testicles without masses.  No inguinal hernias.  Rectal:   Deferred due to age <40 and lack of symptoms  Extremities:   No clubbing, cyanosis or edema  Pulses:   2+ and symmetric all extremities  Skin:   Skin color, texture, turgor normal, no rashes or lesions. 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  Some scarring on upper back from acne.  Small bumps at lower portion of scalp posteriorly, mainly central  Lymph nodes:   Cervical, supraclavicular, and  Inguinal nodes normal  Neurologic:   Normal strength, sensation and gait; reflexes 2+ and symmetric throughout            Psych:   Normal mood, affect, hygiene and grooming.     ***UPDATE SKIN   ASSESSMENT/PLAN:  Any flu shot last year? COVID booster  If different sexual partner, offer STD testing  Discussed at least 30 minutes of aerobic activity at least 5 days/week, weight-bearing exercise at least 2x/week; proper sunscreen use reviewed; healthy diet and alcohol recommendations reviewed; regular seatbelt use; safe sex (condoms, Plan B), alcohol/drug/tobacco counseling.  Immunization recommendations discussed--yearly flu shots are recommended in the Fall.  Recommended updated COVID vaccine in the Fall, when available.  F/u 1 year, sooner prn

## 2023-12-22 ENCOUNTER — Encounter: Payer: Self-pay | Admitting: Family Medicine

## 2023-12-22 ENCOUNTER — Ambulatory Visit (INDEPENDENT_AMBULATORY_CARE_PROVIDER_SITE_OTHER): Admitting: Family Medicine

## 2023-12-22 VITALS — BP 112/72 | HR 76 | Ht 68.0 in | Wt 191.2 lb

## 2023-12-22 DIAGNOSIS — R635 Abnormal weight gain: Secondary | ICD-10-CM

## 2023-12-22 DIAGNOSIS — Z6829 Body mass index (BMI) 29.0-29.9, adult: Secondary | ICD-10-CM

## 2023-12-22 DIAGNOSIS — B360 Pityriasis versicolor: Secondary | ICD-10-CM

## 2023-12-22 DIAGNOSIS — E559 Vitamin D deficiency, unspecified: Secondary | ICD-10-CM

## 2023-12-22 DIAGNOSIS — Z0000 Encounter for general adult medical examination without abnormal findings: Secondary | ICD-10-CM

## 2023-12-22 MED ORDER — KETOCONAZOLE 2 % EX SHAM: 1.0000 | MEDICATED_SHAMPOO | CUTANEOUS | 2 refills | Status: AC

## 2023-12-22 MED ORDER — TRIAMCINOLONE ACETONIDE 0.1 % EX OINT: TOPICAL_OINTMENT | Freq: Two times a day (BID) | CUTANEOUS | 0 refills | Status: AC

## 2024-01-13 ENCOUNTER — Encounter: Payer: Managed Care, Other (non HMO) | Admitting: Family Medicine

## 2025-01-15 ENCOUNTER — Encounter: Payer: Self-pay | Admitting: Family Medicine
